# Patient Record
Sex: Female | Born: 1995 | Race: White | Hispanic: Yes | Marital: Single | Smoking: Never smoker
Health system: Southern US, Community
[De-identification: ages and names within clinical notes are randomized; demographics above are authoritative.]

## PROBLEM LIST (undated history)

## (undated) HISTORY — PX: KNEE SURGERY: SHX244

---

## 2016-07-08 ENCOUNTER — Emergency Department (HOSPITAL_COMMUNITY)
Admission: EM | Admit: 2016-07-08 | Discharge: 2016-07-08 | Disposition: A | Discharge status: Home / Self Care | Payer: BLUE CROSS/BLUE SHIELD | Attending: Emergency Medicine | Admitting: Emergency Medicine

## 2016-07-08 ENCOUNTER — Emergency Department (HOSPITAL_COMMUNITY): Payer: BLUE CROSS/BLUE SHIELD

## 2016-07-08 ENCOUNTER — Encounter (HOSPITAL_COMMUNITY): Payer: Self-pay | Admitting: Family Medicine

## 2016-07-08 DIAGNOSIS — S161XXA Strain of muscle, fascia and tendon at neck level, initial encounter: Secondary | ICD-10-CM | POA: Insufficient documentation

## 2016-07-08 DIAGNOSIS — Y929 Unspecified place or not applicable: Secondary | ICD-10-CM | POA: Diagnosis not present

## 2016-07-08 DIAGNOSIS — X58XXXA Exposure to other specified factors, initial encounter: Secondary | ICD-10-CM | POA: Diagnosis not present

## 2016-07-08 DIAGNOSIS — Z79899 Other long term (current) drug therapy: Secondary | ICD-10-CM | POA: Diagnosis not present

## 2016-07-08 DIAGNOSIS — Y939 Activity, unspecified: Secondary | ICD-10-CM | POA: Diagnosis not present

## 2016-07-08 DIAGNOSIS — S199XXA Unspecified injury of neck, initial encounter: Secondary | ICD-10-CM | POA: Diagnosis present

## 2016-07-08 DIAGNOSIS — Y999 Unspecified external cause status: Secondary | ICD-10-CM | POA: Insufficient documentation

## 2016-07-08 MED ORDER — NAPROXEN 500 MG PO TABS
500.0000 mg | ORAL_TABLET | Freq: Once | ORAL | Status: AC
  Administered 2016-07-08: 500 mg via ORAL
  Filled 2016-07-08: qty 1

## 2016-07-08 MED ORDER — NAPROXEN 500 MG PO TABS: 500.0000 mg | ORAL_TABLET | Freq: Two times a day (BID) | ORAL | 0 refills | Status: AC

## 2016-07-08 NOTE — ED Notes (Signed)
Patient presents with previously stated complaints. Patient has full ROM with neck, but states more pain to the right side of neck. Voice is clear. NAD noted. Denies SOB, denies fevers, denies difficulty swallowing and denies posterior neck pain. A&OX4 at this time.

## 2016-07-08 NOTE — ED Provider Notes (Signed)
[facility]-EMERGENCY DEPT Provider Note   CSN: [id number] Arrival date & time: 07/08/16  0151     History   Chief Complaint Chief Complaint  Patient presents with  . Neck Pain    HPI Eileen Wilson is a 21 y.o. female.  21 year old female presents to the emergency department for evaluation of left-sided neck pain. Patient states that she was lying on her left side when she went to twist and get up when she felt a "pop" to the left side of her neck. She reports feeling a sudden pressure-like sensation in her ears with associated tunnel vision. She did not lose consciousness and these symptoms resolved spontaneously. She has continued to have discomfort to the left side of her neck which is aggravated with certain movements. No medications taken prior to arrival for symptoms. Discomfort has been constant. She denies any radiation of the pain. The patient denies any inability to swallow or drooling. No sensation changes in her extremities. Patient states that the pain continues to make her nervous, so she came to the ED for evaluation.    Neck Pain      History reviewed. No pertinent past medical history.  There are no active problems to display for this patient.   Past Surgical History:  Procedure Laterality Date  . KNEE SURGERY Right     OB History    No data available       Home Medications    Prior to Admission medications   Medication Sig Start Date End Date Taking? Authorizing Provider  PRESCRIPTION MEDICATION Take 1 tablet by mouth daily.   Yes Historical Provider, MD  naproxen (NAPROSYN) 500 MG tablet Take 1 tablet (500 mg total) by mouth 2 (two) times daily. 07/08/16   Antony Madura, PA-C    Family History History reviewed. No pertinent family history.  Social History Social History  Substance Use Topics  . Smoking status: Never Smoker  . Smokeless tobacco: Never Used  . Alcohol use Yes     Comment: Weekends      Allergies   Patient has no known  allergies.   Review of Systems Review of Systems  Musculoskeletal: Positive for neck pain.   Ten systems reviewed and are negative for acute change, except as noted in the HPI.    Physical Exam Updated Vital Signs BP 132/76 (BP Location: Left Arm)   Pulse 109   Temp 97.4 F (36.3 C)   Resp 20   Ht 5\' 8"  (1.727 m)   Wt 63.5 kg   SpO2 98%   BMI 21.29 kg/m   Physical Exam  Constitutional: She is oriented to person, place, and time. She appears well-developed and well-nourished. No distress.  Nontoxic and in NAD  HENT:  Head: Normocephalic and atraumatic.  Mouth/Throat: Oropharynx is clear and moist.  Oropharynx clear. Patient tolerating secretions without difficulty.  Eyes: Conjunctivae and EOM are normal. No scleral icterus.  Neck: Normal range of motion.    No stridor. No meningismus. TTP along the inferior aspect of the SCM. Mild muscle swelling. No appreciable spasm. No carotid bruit bilaterally.   Cardiovascular: Normal rate, regular rhythm and intact distal pulses.   Borderline tachycardia, likely 2/2 anxiety.  Pulmonary/Chest: Effort normal. No stridor. No respiratory distress. She has no wheezes.  Respirations even and unlabored. Lungs CTAB.  Musculoskeletal: Normal range of motion.  Neurological: She is alert and oriented to person, place, and time. She exhibits normal muscle tone. Coordination normal.  Skin: Skin is warm and dry.  No rash noted. She is not diaphoretic. No erythema. No pallor.  Psychiatric: Her behavior is normal. Her mood appears anxious.  Nursing note and vitals reviewed.    ED Treatments / Results  Labs (all labs ordered are listed, but only abnormal results are displayed) Labs Reviewed - No data to display  EKG  EKG Interpretation None       Radiology Dg Neck Soft Tissue  Result Date: 07/08/2016 CLINICAL DATA:  Felt pop in neck. Near syncope. Anterior neck pain and tightness, acute onset. Initial encounter. EXAM: NECK SOFT TISSUES  - 1+ VIEW COMPARISON:  None. FINDINGS: There is no evidence of fracture or subluxation along the cervical spine. Vertebral bodies demonstrate normal height and alignment. The visualized paranasal sinuses and mastoid air cells are well-aerated. The prevertebral soft tissues are within normal limits. The nasopharynx, oropharynx and hypopharynx are grossly unremarkable. The epiglottis is normal in thickness. The proximal trachea is unremarkable. The visualized lung apices are clear. IMPRESSION: Unremarkable radiographs of the soft tissues of the neck. The musculature is not well assessed on radiograph. Electronically Signed   By: Roanna RaiderJeffery  Chang M.D.   On: 07/08/2016 03:29    Procedures Procedures (including critical care time)   Medications Ordered in ED Medications  naproxen (NAPROSYN) tablet 500 mg (500 mg Oral Given 07/08/16 [id number])     Initial Impression / Assessment and Plan / ED Course  I have reviewed the triage vital signs and the nursing notes.  Pertinent labs & imaging results that were available during my care of the patient were reviewed by me and considered in my medical decision making (see chart for details).     21 year old female presents to the emergency department for evaluation of left-sided neck pain. Pain is reproducible on palpation. Symptoms consistent with musculoskeletal etiology; likely SCM strain. There is no evidence of mass effect on the patient's airway on x-ray today. She has no stridor. Patient tolerating secretions without difficulty. No carotid bruits or palpable deformity or crepitus. Will manage supportively with NSAIDs and icing. Return precautions discussed and provided. Patient discharged in stable condition with no unaddressed concerns.   Final Clinical Impressions(s) / ED Diagnoses   Final diagnoses:  Strain of sternocleidomastoid muscle, initial encounter    New Prescriptions New Prescriptions   NAPROXEN (NAPROSYN) 500 MG TABLET    Take 1 tablet (500  mg total) by mouth 2 (two) times daily.     Antony MaduraKelly Eiko Mcgowen, PA-C 07/08/16 [id number]    April Palumbo, MD 07/08/16 [phone]

## 2016-07-08 NOTE — Discharge Instructions (Signed)
You may have persistent neck stiffness and discomfort. Symptoms may worsen over the first 48 hours. This may progress to muscle spasm referred to torticollis. Take Naproxen as prescribed for 1 week. Apply ice to areas of pain 3-4 times per day for 15-20 minutes each time. Follow up with a primary care doctor to ensure resolution of symptoms. You may return, as needed, for new or concerning symptoms.

## 2016-07-08 NOTE — ED Triage Notes (Signed)
Patient reports she was lying on her left side when she suddenly got up, she felt a "pop" in her throat, had visual disturbances, and pressure to her ears. Also, states she got nausea, increased visual disturbances when touching her throat, and heart racing. Pt appears in no acute distress. Pt was ambulatory with a steady gait from the lobby to assigned room and is resting quietly while triaging.

## 2018-09-03 IMAGING — CR DG NECK SOFT TISSUE
2 series · 2 of 2 positions shown · non-contrast
Comparison: None.

CLINICAL DATA: Felt pop in neck. Near syncope. Anterior neck pain
and tightness, acute onset. Initial encounter.

EXAM:
NECK SOFT TISSUES - 1+ VIEW

[w soft tissue neck ap]
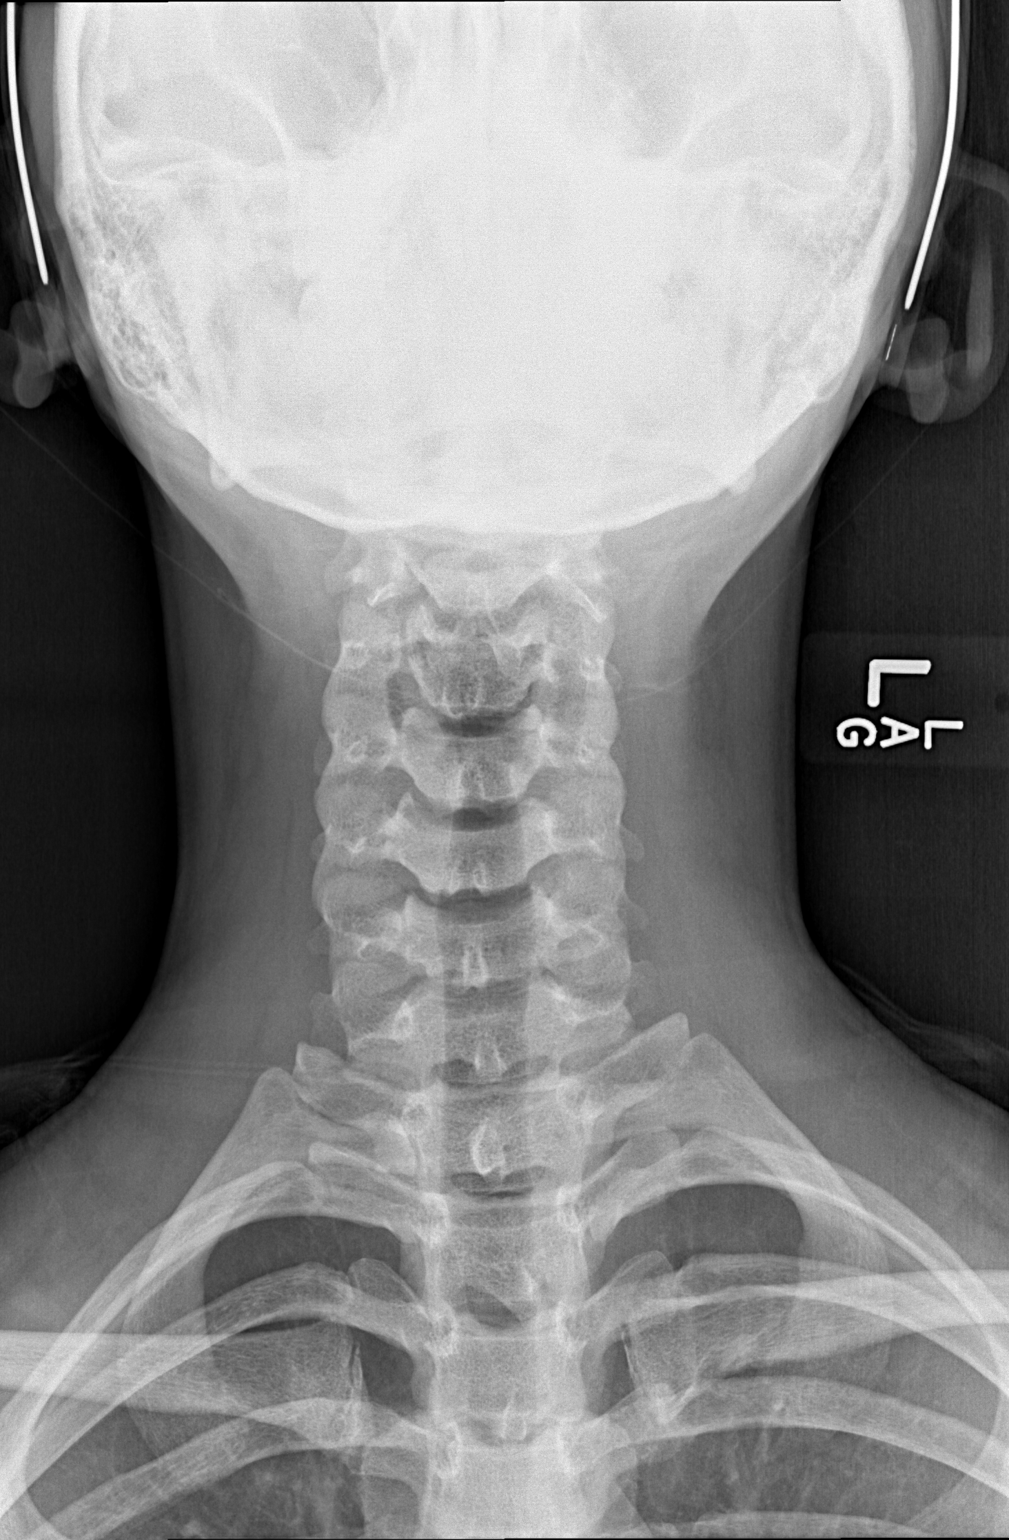

[w soft tissue neck lat]
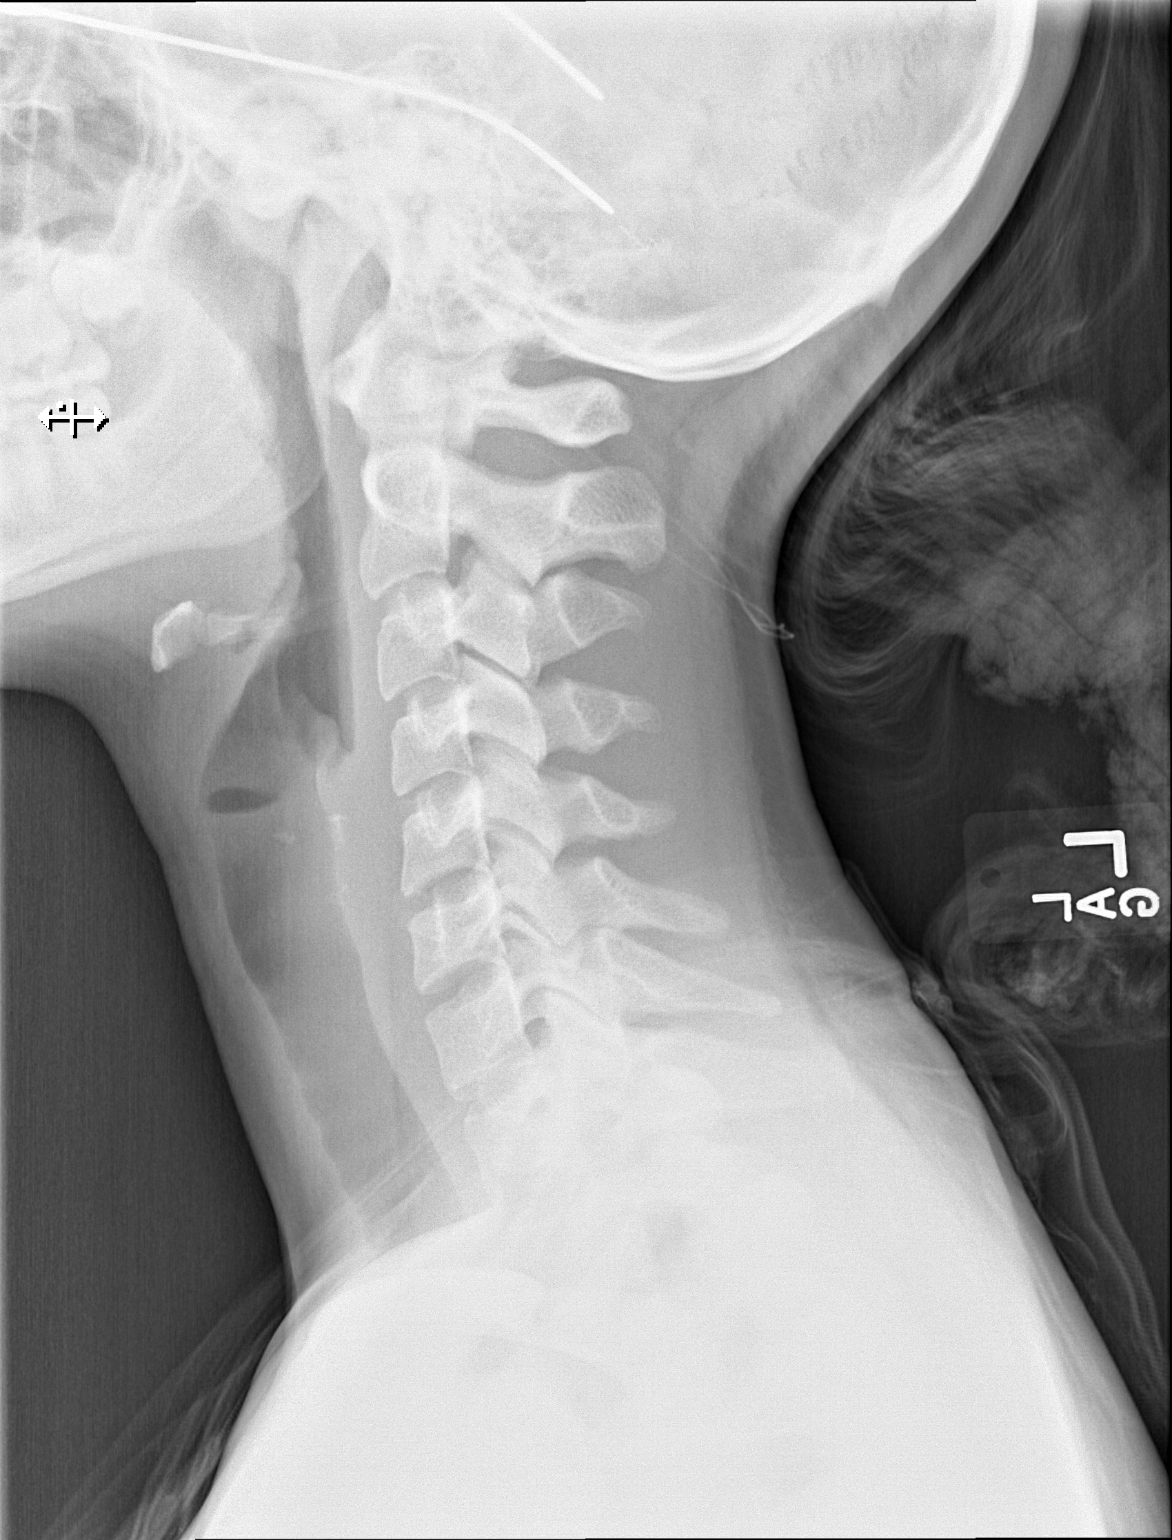

[2 of 2 positions shown; findings below may reference images not displayed]

FINDINGS: There is no evidence of fracture or subluxation along the cervical
spine. Vertebral bodies demonstrate normal height and alignment. The
visualized paranasal sinuses and mastoid air cells are well-aerated.

The prevertebral soft tissues are within normal limits. The
nasopharynx, oropharynx and hypopharynx are grossly unremarkable.
The epiglottis is normal in thickness. The proximal trachea is
unremarkable. The visualized lung apices are clear.
IMPRESSION: Unremarkable radiographs of the soft tissues of the neck. The
musculature is not well assessed on radiograph.

## 2019-01-08 ENCOUNTER — Other Ambulatory Visit: Payer: Self-pay

## 2019-01-08 DIAGNOSIS — Z20822 Contact with and (suspected) exposure to covid-19: Secondary | ICD-10-CM

## 2019-01-09 LAB — NOVEL CORONAVIRUS, NAA: SARS-CoV-2, NAA: NOT DETECTED

## 2019-01-18 ENCOUNTER — Other Ambulatory Visit: Payer: Self-pay

## 2019-01-18 DIAGNOSIS — Z20822 Contact with and (suspected) exposure to covid-19: Secondary | ICD-10-CM

## 2019-01-20 LAB — NOVEL CORONAVIRUS, NAA: SARS-CoV-2, NAA: NOT DETECTED

## 2019-01-23 ENCOUNTER — Other Ambulatory Visit: Payer: Self-pay | Admitting: Registered"

## 2019-01-23 DIAGNOSIS — Z20822 Contact with and (suspected) exposure to covid-19: Secondary | ICD-10-CM

## 2019-01-25 ENCOUNTER — Telehealth: Payer: Self-pay | Admitting: *Deleted

## 2019-01-25 ENCOUNTER — Other Ambulatory Visit: Payer: Self-pay

## 2019-01-25 DIAGNOSIS — Z20822 Contact with and (suspected) exposure to covid-19: Secondary | ICD-10-CM

## 2019-01-25 LAB — NOVEL CORONAVIRUS, NAA: SARS-CoV-2, NAA: DETECTED — AB

## 2019-01-25 NOTE — Telephone Encounter (Signed)
Pt called regarding the results of covid-19 that she saw on MyChart. She is positive for the virus. Explained to her that she should be in quarantine for at least 10 days but possibly 14. She denies having symptoms. She was exposed by her room mate who is having symptoms. Advised of s/s of the virus. Also to be checking her temp and if no fever and still not symptoms she can come out of quarantine. Advised that the health department will be notified and will contact her. She voiced understanding.

## 2019-01-27 LAB — NOVEL CORONAVIRUS, NAA: SARS-CoV-2, NAA: NOT DETECTED

## 2019-01-30 ENCOUNTER — Other Ambulatory Visit: Payer: Self-pay

## 2019-01-30 DIAGNOSIS — Z20822 Contact with and (suspected) exposure to covid-19: Secondary | ICD-10-CM

## 2019-01-31 LAB — NOVEL CORONAVIRUS, NAA: SARS-CoV-2, NAA: NOT DETECTED

## 2019-02-20 ENCOUNTER — Other Ambulatory Visit: Payer: Self-pay

## 2019-02-20 DIAGNOSIS — Z20822 Contact with and (suspected) exposure to covid-19: Secondary | ICD-10-CM

## 2019-02-21 LAB — NOVEL CORONAVIRUS, NAA: SARS-CoV-2, NAA: NOT DETECTED

## 2019-05-25 ENCOUNTER — Ambulatory Visit: Payer: BC Managed Care – PPO | Attending: Internal Medicine

## 2019-05-25 DIAGNOSIS — Z20822 Contact with and (suspected) exposure to covid-19: Secondary | ICD-10-CM

## 2019-05-26 LAB — NOVEL CORONAVIRUS, NAA: SARS-CoV-2, NAA: NOT DETECTED
